# Patient Record
Sex: Female | Born: 1995 | Race: White | Hispanic: No | Marital: Married | Smoking: Current some day smoker
Health system: Southern US, Community
[De-identification: ages and names within clinical notes are randomized; demographics above are authoritative.]

---
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
## 2017-12-05 ENCOUNTER — Emergency Department: Payer: No Typology Code available for payment source

## 2017-12-05 ENCOUNTER — Emergency Department
Admission: EM | Admit: 2017-12-05 | Discharge: 2017-12-05 | Disposition: A | Discharge status: Home / Self Care | Payer: No Typology Code available for payment source | Attending: Emergency Medicine | Admitting: Emergency Medicine

## 2017-12-05 ENCOUNTER — Encounter: Payer: Self-pay | Admitting: Emergency Medicine

## 2017-12-05 ENCOUNTER — Other Ambulatory Visit: Payer: Self-pay

## 2017-12-05 DIAGNOSIS — Y939 Activity, unspecified: Secondary | ICD-10-CM | POA: Diagnosis not present

## 2017-12-05 DIAGNOSIS — M545 Low back pain, unspecified: Secondary | ICD-10-CM

## 2017-12-05 DIAGNOSIS — Z79899 Other long term (current) drug therapy: Secondary | ICD-10-CM | POA: Diagnosis not present

## 2017-12-05 DIAGNOSIS — S50812A Abrasion of left forearm, initial encounter: Secondary | ICD-10-CM | POA: Diagnosis not present

## 2017-12-05 DIAGNOSIS — Y999 Unspecified external cause status: Secondary | ICD-10-CM | POA: Insufficient documentation

## 2017-12-05 DIAGNOSIS — F17200 Nicotine dependence, unspecified, uncomplicated: Secondary | ICD-10-CM | POA: Diagnosis not present

## 2017-12-05 DIAGNOSIS — Y92410 Unspecified street and highway as the place of occurrence of the external cause: Secondary | ICD-10-CM | POA: Diagnosis not present

## 2017-12-05 DIAGNOSIS — T07XXXA Unspecified multiple injuries, initial encounter: Secondary | ICD-10-CM

## 2017-12-05 DIAGNOSIS — S59911A Unspecified injury of right forearm, initial encounter: Secondary | ICD-10-CM | POA: Diagnosis present

## 2017-12-05 DIAGNOSIS — S50811A Abrasion of right forearm, initial encounter: Secondary | ICD-10-CM | POA: Insufficient documentation

## 2017-12-05 DIAGNOSIS — M7918 Myalgia, other site: Secondary | ICD-10-CM | POA: Diagnosis not present

## 2017-12-05 LAB — POCT PREGNANCY, URINE: Preg Test, Ur: NEGATIVE

## 2017-12-05 MED ORDER — NAPROXEN 500 MG PO TABS: 500.0000 mg | ORAL_TABLET | Freq: Two times a day (BID) | ORAL | 0 refills | Status: DC

## 2017-12-05 MED ORDER — ONDANSETRON 4 MG PO TBDP: 4.0000 mg | ORAL_TABLET | Freq: Three times a day (TID) | ORAL | 0 refills | Status: DC | PRN

## 2017-12-05 MED ORDER — CYCLOBENZAPRINE HCL 10 MG PO TABS: 10.0000 mg | ORAL_TABLET | Freq: Three times a day (TID) | ORAL | 0 refills | Status: DC | PRN

## 2017-12-05 MED ORDER — NAPROXEN 500 MG PO TABS
500.0000 mg | ORAL_TABLET | Freq: Once | ORAL | Status: AC
  Administered 2017-12-05: 500 mg via ORAL
  Filled 2017-12-05: qty 1

## 2017-12-05 MED ORDER — CYCLOBENZAPRINE HCL 10 MG PO TABS
10.0000 mg | ORAL_TABLET | Freq: Once | ORAL | Status: AC
  Administered 2017-12-05: 10 mg via ORAL
  Filled 2017-12-05: qty 1
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
## 2019-03-19 ENCOUNTER — Emergency Department: Payer: Self-pay

## 2019-03-19 ENCOUNTER — Other Ambulatory Visit: Payer: Self-pay

## 2019-03-19 ENCOUNTER — Emergency Department
Admission: EM | Admit: 2019-03-19 | Discharge: 2019-03-19 | Disposition: A | Discharge status: Home / Self Care | Payer: Self-pay | Attending: Emergency Medicine | Admitting: Emergency Medicine

## 2019-03-19 DIAGNOSIS — Y9301 Activity, walking, marching and hiking: Secondary | ICD-10-CM | POA: Insufficient documentation

## 2019-03-19 DIAGNOSIS — S93402A Sprain of unspecified ligament of left ankle, initial encounter: Secondary | ICD-10-CM

## 2019-03-19 DIAGNOSIS — Y999 Unspecified external cause status: Secondary | ICD-10-CM | POA: Insufficient documentation

## 2019-03-19 DIAGNOSIS — Y9289 Other specified places as the place of occurrence of the external cause: Secondary | ICD-10-CM | POA: Insufficient documentation

## 2019-03-19 DIAGNOSIS — X501XXA Overexertion from prolonged static or awkward postures, initial encounter: Secondary | ICD-10-CM | POA: Insufficient documentation

## 2019-03-19 DIAGNOSIS — S93492A Sprain of other ligament of left ankle, initial encounter: Secondary | ICD-10-CM | POA: Insufficient documentation

## 2019-03-19 DIAGNOSIS — F17210 Nicotine dependence, cigarettes, uncomplicated: Secondary | ICD-10-CM | POA: Insufficient documentation

## 2019-03-19 MED ORDER — MELOXICAM 15 MG PO TABS: 15.0000 mg | ORAL_TABLET | Freq: Every day | ORAL | 1 refills | Status: AC
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
## 2019-03-19 NOTE — ED Provider Notes (Signed)
[facility] Emergency Department Provider Note  ____________________________________________  Time seen: Approximately 5:24 PM  I have reviewed the triage vital signs and the nursing notes.   HISTORY  Chief Complaint Ankle Pain    HPI Paula Bennett is a 23 y.o. female presents to the emergency department with pain and difficulty ambulating since she inverted her left ankle.  Patient states that she was outside looking for her dog when she stepped down unexpectedly to uneven terrain.  Patient has pain both over the anterior talofibular ligament and the deltoid ligament.  She did not hit her head or neck during injury.  She states that she has had difficulty bearing weight since injury occurred.  She denies prior left ankle injuries in the past.  No numbness or tingling in the bilateral lower extremities.  No other alleviating measures have been attempted.        History reviewed. No pertinent past medical history.  There are no active problems to display for this patient.   History reviewed. No pertinent surgical history.  Prior to Admission medications   Medication Sig Start Date End Date Taking? Authorizing Provider  cyclobenzaprine (FLEXERIL) 10 MG tablet Take 1 tablet (10 mg total) by mouth 3 (three) times daily as needed for muscle spasms. 12/05/17   Triplett, Rulon Eisenmenger B, FNP  meloxicam (MOBIC) 15 MG tablet Take 1 tablet (15 mg total) by mouth daily for 7 days. 03/19/19 03/26/19  Orvil Feil, PA-C  naproxen (NAPROSYN) 500 MG tablet Take 1 tablet (500 mg total) by mouth 2 (two) times daily with a meal. 12/05/17   Triplett, Cari B, FNP  ondansetron (ZOFRAN-ODT) 4 MG disintegrating tablet Take 1 tablet (4 mg total) by mouth every 8 (eight) hours as needed for nausea or vomiting. 12/05/17   Triplett, Cari B, FNP  oxyCODONE-acetaminophen (ROXICET) 5-325 MG tablet Take 1-2 tablets by mouth every 4 (four) hours as needed for severe pain. 10/06/15   Beers, Charmayne Sheer, PA-C    Allergies Patient has no known allergies.  No family history on file.  Social History Social History   Tobacco Use  . Smoking status: Current Some Day Smoker  . Smokeless tobacco: Never Used  Substance Use Topics  . Alcohol use: No  . Drug use: No     Review of Systems  Constitutional: No fever/chills Eyes: No visual changes. No discharge ENT: No upper respiratory complaints. Cardiovascular: no chest pain. Respiratory: no cough. No SOB. Gastrointestinal: No abdominal pain.  No nausea, no vomiting.  No diarrhea.  No constipation. Musculoskeletal: Patient has left ankle pain.  Skin: Negative for rash, abrasions, lacerations, ecchymosis. Neurological: Negative for headaches, focal weakness or numbness.  ____________________________________________   PHYSICAL EXAM:  VITAL SIGNS: ED Triage Vitals  Enc Vitals Group     BP 03/19/19 1505 122/75     Pulse Rate 03/19/19 1505 84     Resp 03/19/19 1505 17     Temp 03/19/19 1505 98.2 F (36.8 C)     Temp Source 03/19/19 1505 Oral     SpO2 03/19/19 1505 97 %     Weight 03/19/19 1506 185 lb (83.9 kg)     Height 03/19/19 1506 5\' 7"  (1.702 m)     Head Circumference --      Peak Flow --      Pain Score 03/19/19 1506 10     Pain Loc --      Pain Edu? --      Excl. in  GC? --      Constitutional: Alert and oriented. Well appearing and in no acute distress. Eyes: Conjunctivae are normal. PERRL. EOMI. Head: Atraumatic. UJW:JXBJYNWGNFAOZHENT:Cardiovascular: Normal rate, regular rhythm. Normal S1 and S2.  Good peripheral circulation. Respiratory: Normal respiratory effort without tachypnea or retractions. Lungs CTAB. Good air entry to the bases with no decreased or absent breath sounds. Gastrointestinal: Bowel sounds 4 quadrants. Soft and nontender to palpation. No guarding or rigidity. No palpable masses. No distention. No CVA tenderness. Musculoskeletal: Patient has difficulty performing full range of motion at left ankle.  She has  tenderness to palpation of the anterior talofibular ligament and the deltoid ligament.  She is able to move all 5 toes.  Palpable dorsalis pedis pulse, left.  Capillary refill less than 2 seconds of all 5 left toes. Neurologic:  Normal speech and language. No gross focal neurologic deficits are appreciated.  Skin:  Skin is warm, dry and intact. No rash noted. Psychiatric: Mood and affect are normal. Speech and behavior are normal. Patient exhibits appropriate insight and judgement.   ____________________________________________   LABS (all labs ordered are listed, but only abnormal results are displayed)  Labs Reviewed - No data to display ____________________________________________  EKG   ____________________________________________  RADIOLOGY I personally viewed and evaluated these images as part of my medical decision making, as well as reviewing the written report by the radiologist.  Dg Ankle Complete Left  Result Date: 03/19/2019 CLINICAL DATA:  Patient complains of left ankle pain, patient states she was walking outside when she stumbled on left ankle, patient states she heard "3 pops" Patient states she has rolled her left ankle before and had previous injuries to left tib/fib. EXAM: LEFT ANKLE COMPLETE - 3+ VIEW COMPARISON:  Left ankle radiographs 10/06/2015 FINDINGS: There is no evidence of fracture, dislocation, or joint effusion. Joint spaces well maintained. Talar dome intact. There is no evidence of arthropathy or other focal bone abnormality. Mild lateral ankle soft tissue swelling. IMPRESSION: No acute osseous abnormality in the left ankle. Electronically Signed   By: Emmaline KluverNancy  Ballantyne M.D.   On: 03/19/2019 16:00    ____________________________________________    PROCEDURES  Procedure(s) performed:    Procedures    Medications - No data to display   ____________________________________________   INITIAL IMPRESSION / ASSESSMENT AND PLAN / ED  COURSE  Pertinent labs & imaging results that were available during my care of the patient were reviewed by me and considered in my medical decision making (see chart for details).  Review of the [place] CSRS was performed in accordance of the [facility] prior to dispensing any controlled drugs.           Assessment and plan Left ankle pain 23 year old female presents to the emergency department with acute left ankle pain after an inversion type ankle injury.  Vital signs were reassuring at triage.  Patient had tenderness to palpation over the anterior talofibular ligament and the deltoid ligament.  X-ray examination was noncontributory for acute fracture.  An Ace wrap was applied in the emergency department and crutches were provided.  Patient was discharged with meloxicam after she denied possibility of pregnancy.  She was advised to follow-up with podiatry as needed if conservative management techniques fail.  All patient questions were answered.     ____________________________________________  FINAL CLINICAL IMPRESSION(S) / ED DIAGNOSES  Final diagnoses:  Sprain of left ankle, unspecified ligament, initial encounter      NEW MEDICATIONS STARTED DURING THIS VISIT:  ED Discharge Orders  Ordered    meloxicam (MOBIC) 15 MG tablet  Daily     03/19/19 1622              This chart was dictated using voice recognition software/Dragon. Despite best efforts to proofread, errors can occur which can change the meaning. Any change was purely unintentional.    Lannie Fields, PA-C 03/19/19 1727    Carrie Mew, MD 03/19/19 2251

## 2020-04-17 IMAGING — DX DG ANKLE COMPLETE 3+V*L*
3 series · 3 of 3 positions shown · non-contrast
Comparison: Left ankle radiographs 10/06/2015

CLINICAL DATA: Patient complains of left ankle pain, patient states
she was walking outside when she stumbled on left ankle, patient
states she heard "3 pops" Patient states she has rolled her left
ankle before and had previous injuries to left tib/fib.

EXAM:
LEFT ANKLE COMPLETE - 3+ VIEW

[ankle ap]
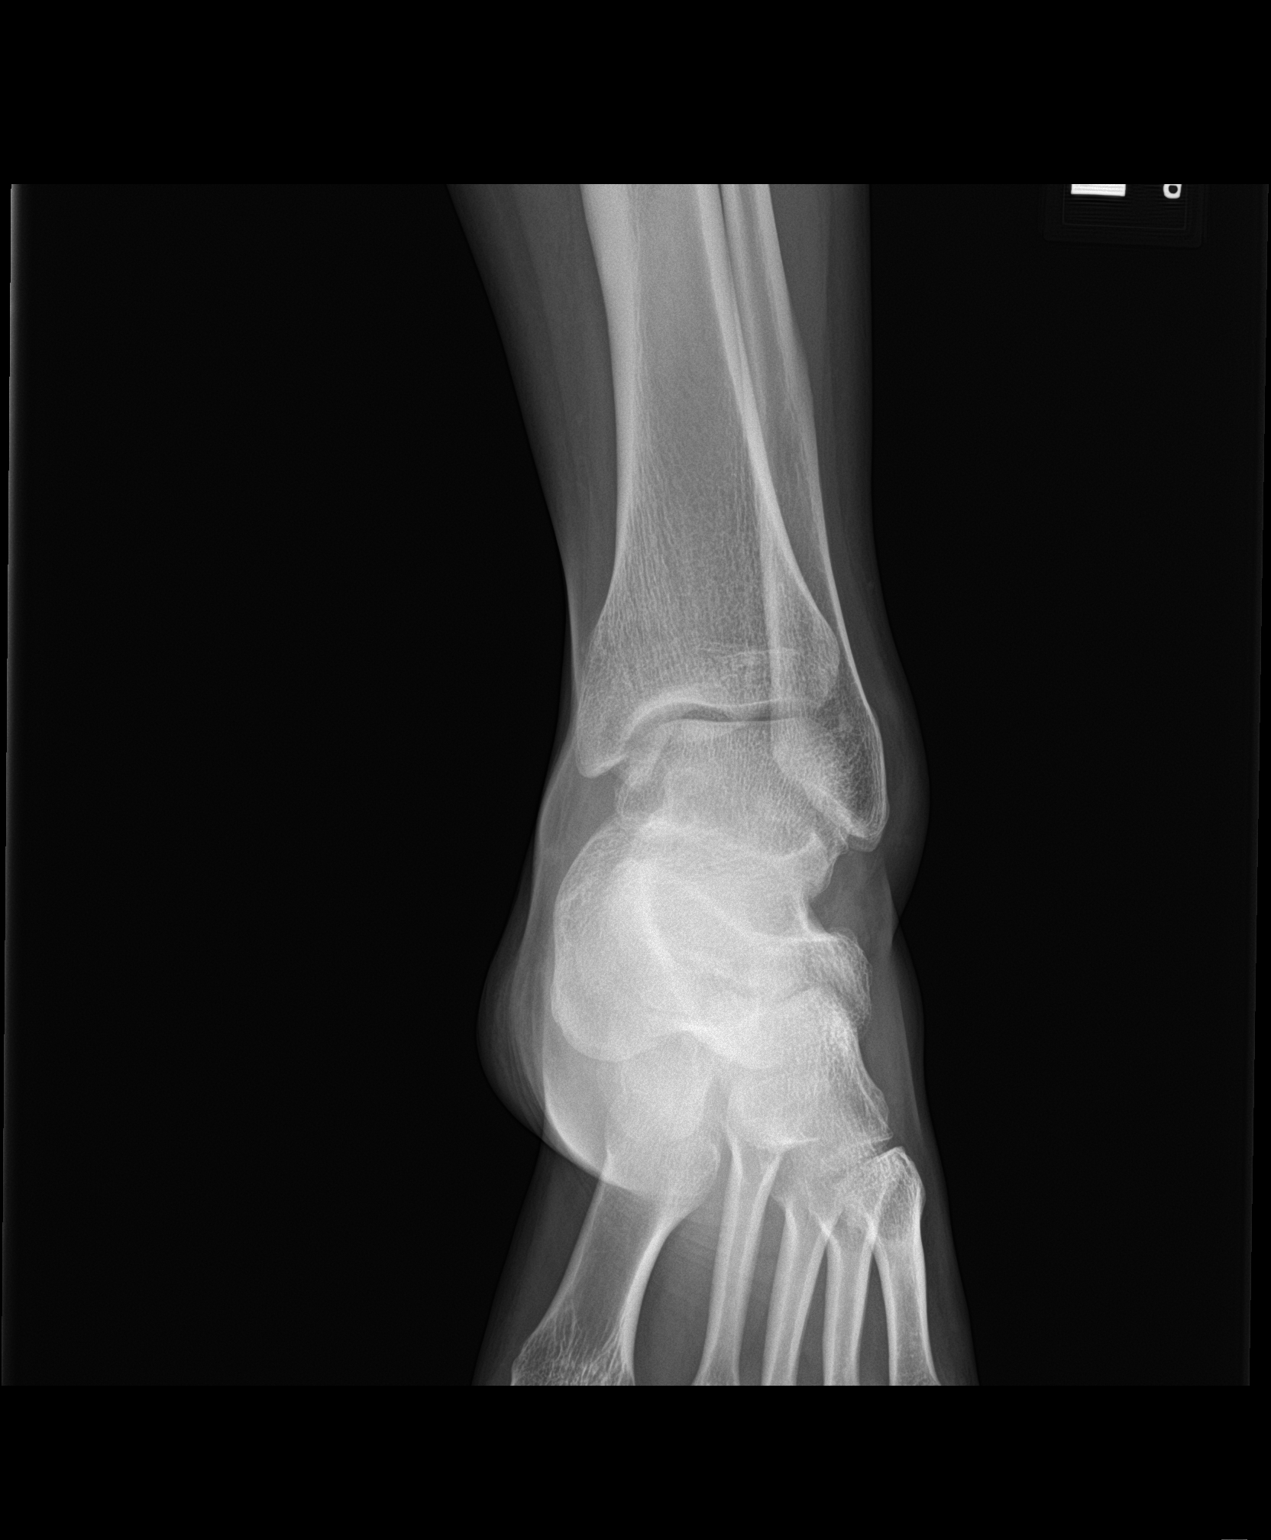

[ankle obl]
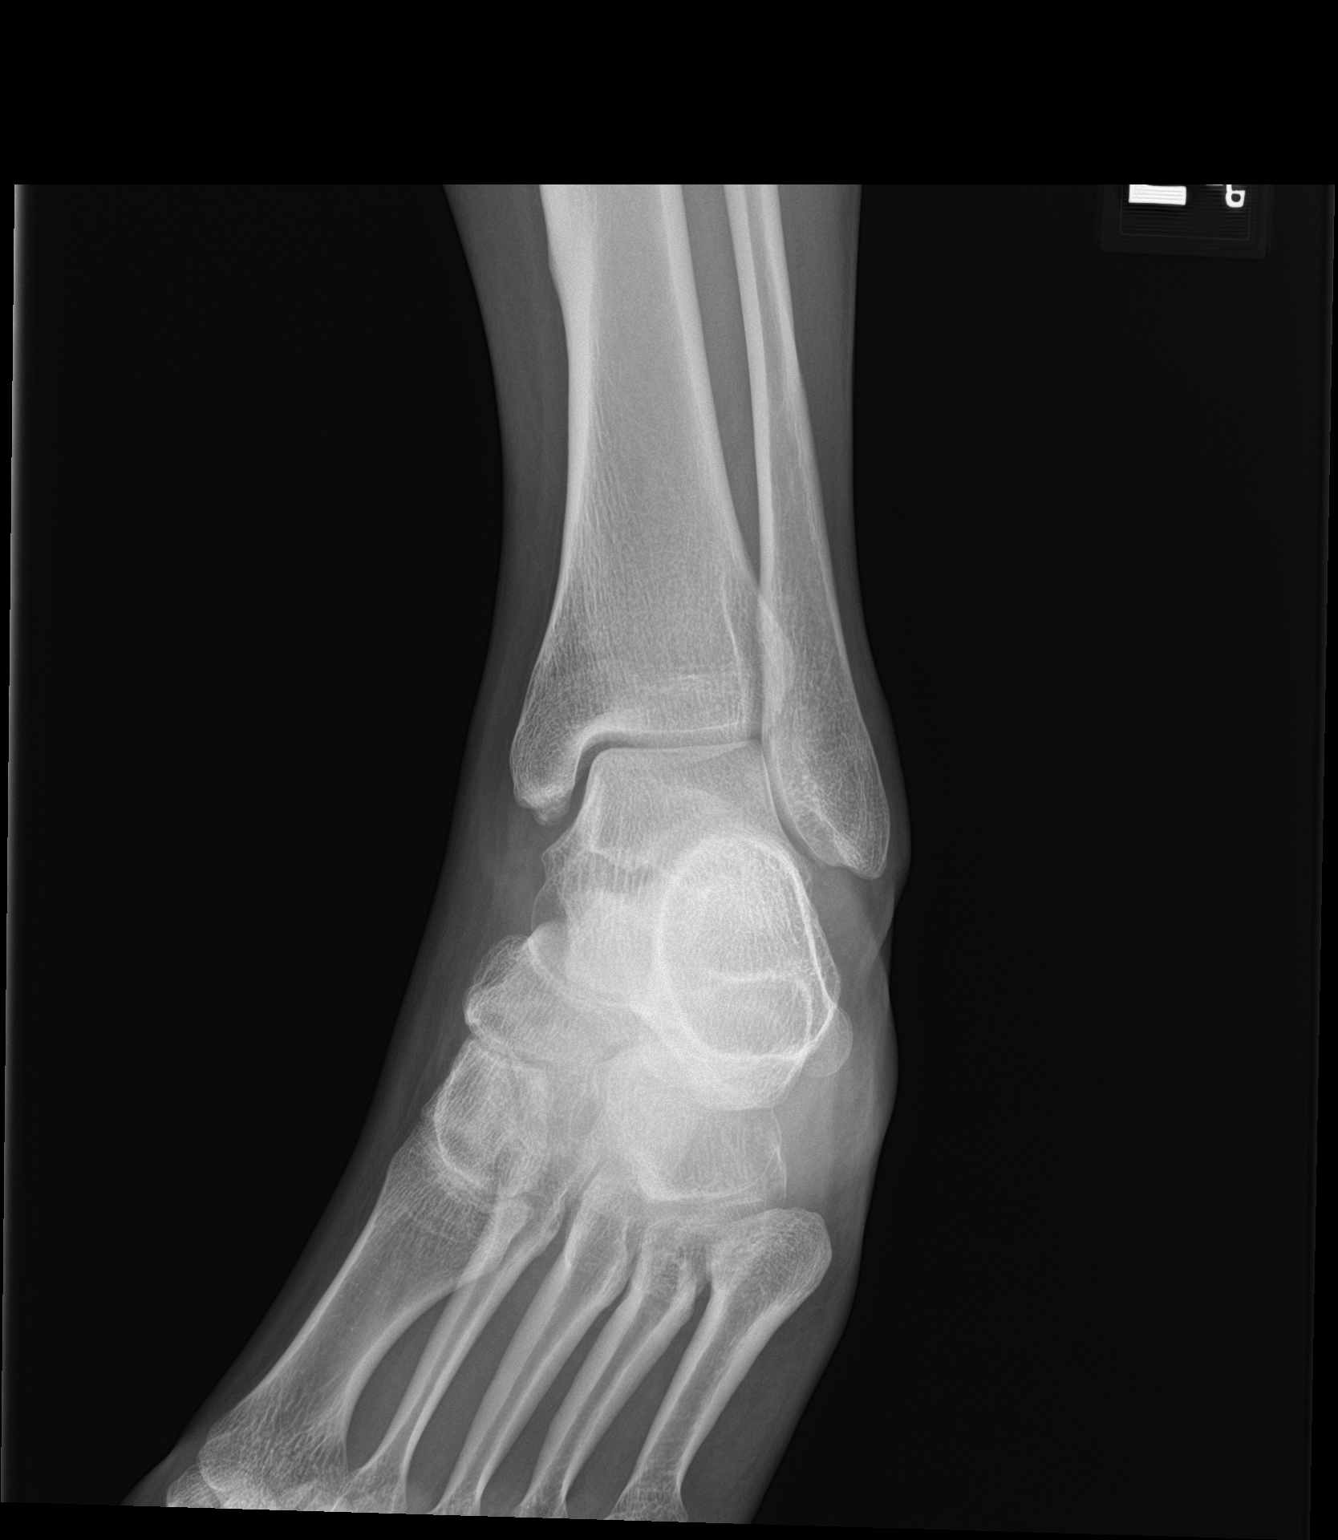

[ankle lat]
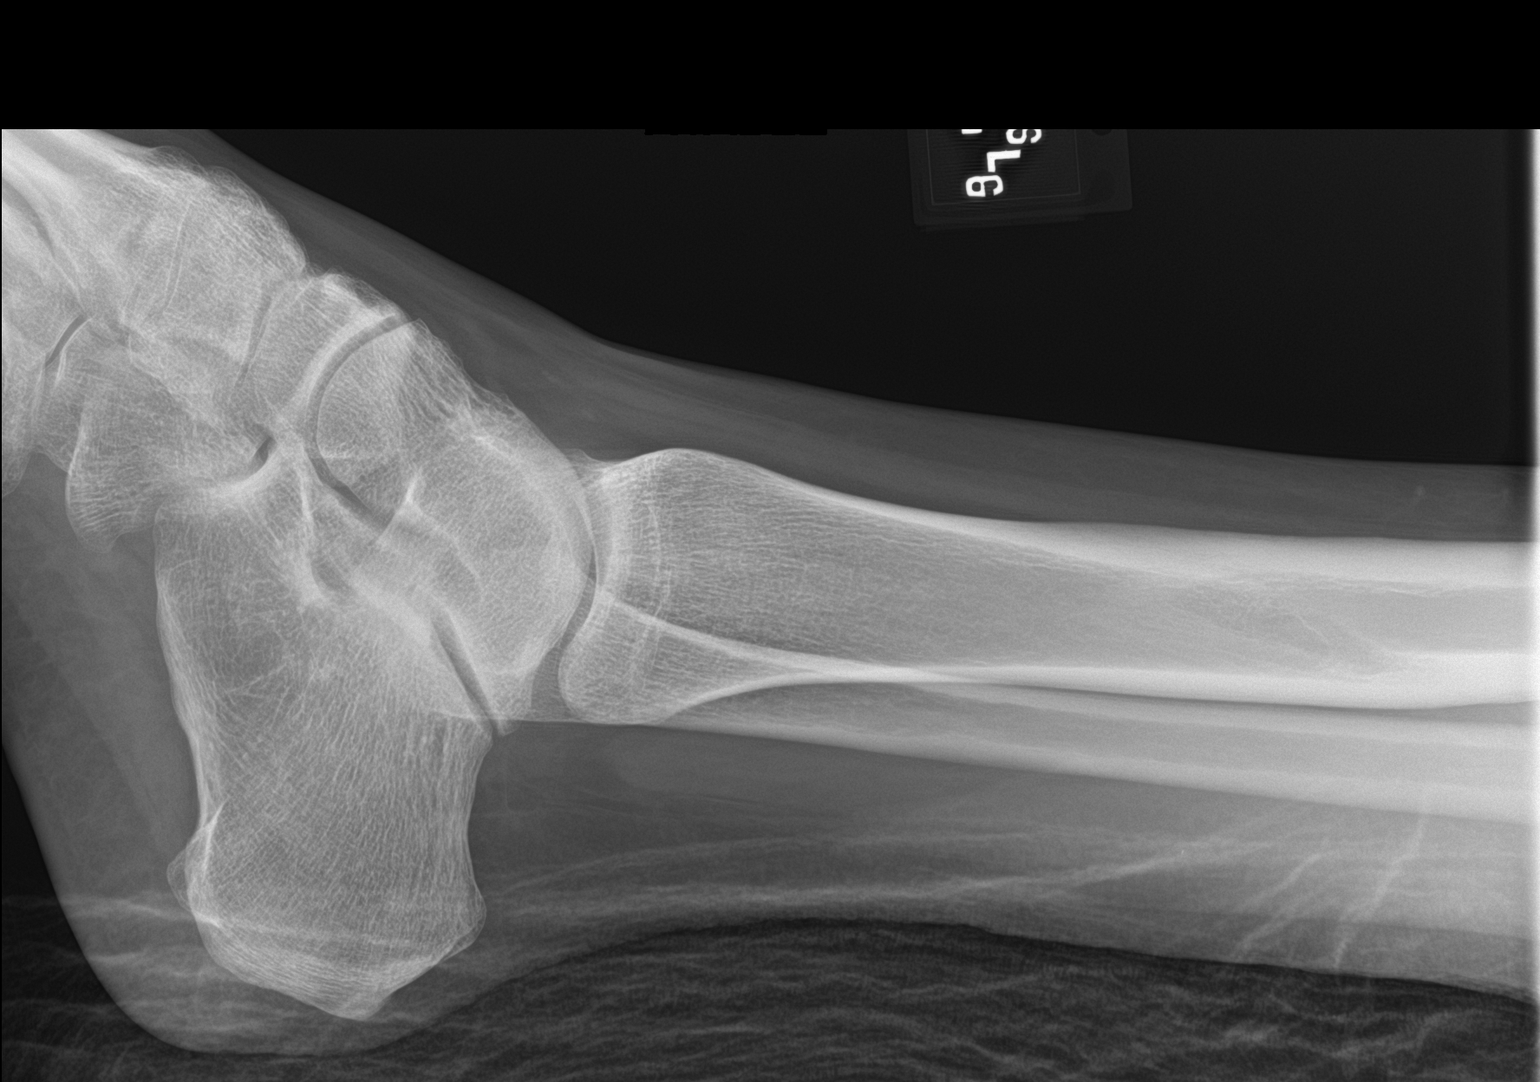

[3 of 3 positions shown; findings below may reference images not displayed]

FINDINGS: There is no evidence of fracture, dislocation, or joint effusion.
Joint spaces well maintained. Talar dome intact. There is no
evidence of arthropathy or other focal bone abnormality. Mild
lateral ankle soft tissue swelling.
IMPRESSION: No acute osseous abnormality in the left ankle.

## 2020-06-16 ENCOUNTER — Emergency Department: Payer: Self-pay

## 2020-06-16 ENCOUNTER — Other Ambulatory Visit: Payer: Self-pay

## 2020-06-16 ENCOUNTER — Emergency Department
Admission: EM | Admit: 2020-06-16 | Discharge: 2020-06-16 | Disposition: A | Discharge status: Home / Self Care | Payer: Self-pay | Attending: Emergency Medicine | Admitting: Emergency Medicine

## 2020-06-16 DIAGNOSIS — R0789 Other chest pain: Secondary | ICD-10-CM

## 2020-06-16 DIAGNOSIS — R1010 Upper abdominal pain, unspecified: Secondary | ICD-10-CM | POA: Insufficient documentation

## 2020-06-16 DIAGNOSIS — Z8616 Personal history of COVID-19: Secondary | ICD-10-CM | POA: Insufficient documentation

## 2020-06-16 DIAGNOSIS — F17200 Nicotine dependence, unspecified, uncomplicated: Secondary | ICD-10-CM | POA: Insufficient documentation

## 2020-06-16 DIAGNOSIS — M940 Chondrocostal junction syndrome [Tietze]: Secondary | ICD-10-CM | POA: Insufficient documentation

## 2020-06-16 LAB — URINE DRUG SCREEN, QUALITATIVE (ARMC ONLY)
Amphetamines, Ur Screen: NOT DETECTED
Barbiturates, Ur Screen: NOT DETECTED
Benzodiazepine, Ur Scrn: NOT DETECTED
Cannabinoid 50 Ng, Ur ~~LOC~~: POSITIVE — AB
Cocaine Metabolite,Ur ~~LOC~~: NOT DETECTED
MDMA (Ecstasy)Ur Screen: NOT DETECTED
Methadone Scn, Ur: NOT DETECTED
Opiate, Ur Screen: NOT DETECTED
Phencyclidine (PCP) Ur S: NOT DETECTED
Tricyclic, Ur Screen: NOT DETECTED

## 2020-06-16 LAB — URINALYSIS, COMPLETE (UACMP) WITH MICROSCOPIC
Bacteria, UA: NONE SEEN
Bilirubin Urine: NEGATIVE
Glucose, UA: NEGATIVE mg/dL
Hgb urine dipstick: NEGATIVE
Ketones, ur: NEGATIVE mg/dL
Leukocytes,Ua: NEGATIVE
Nitrite: NEGATIVE
Protein, ur: NEGATIVE mg/dL
Specific Gravity, Urine: 1.029 (ref 1.005–1.030)
pH: 6 (ref 5.0–8.0)

## 2020-06-16 LAB — COMPREHENSIVE METABOLIC PANEL
ALT: 15 U/L (ref 0–44)
AST: 18 U/L (ref 15–41)
Albumin: 4.3 g/dL (ref 3.5–5.0)
Alkaline Phosphatase: 53 U/L (ref 38–126)
Anion gap: 11 (ref 5–15)
BUN: 16 mg/dL (ref 6–20)
CO2: 21 mmol/L — ABNORMAL LOW (ref 22–32)
Calcium: 9.4 mg/dL (ref 8.9–10.3)
Chloride: 109 mmol/L (ref 98–111)
Creatinine, Ser: 0.81 mg/dL (ref 0.44–1.00)
GFR, Estimated: >60 mL/min (ref >60)
Glucose, Bld: 101 mg/dL — ABNORMAL HIGH (ref 70–99)
Potassium: 3.8 mmol/L (ref 3.5–5.1)
Sodium: 141 mmol/L (ref 135–145)
Total Bilirubin: 0.5 mg/dL (ref 0.3–1.2)
Total Protein: 7.5 g/dL (ref 6.5–8.1)

## 2020-06-16 LAB — CBC
HCT: 39.9 % (ref 36.0–46.0)
Hemoglobin: 13.8 g/dL (ref 12.0–15.0)
MCH: 29.4 pg (ref 26.0–34.0)
MCHC: 34.6 g/dL (ref 30.0–36.0)
MCV: 85.1 fL (ref 80.0–100.0)
Platelets: 222 10*3/uL (ref 150–400)
RBC: 4.69 MIL/uL (ref 3.87–5.11)
RDW: 12.1 % (ref 11.5–15.5)
WBC: 6.9 10*3/uL (ref 4.0–10.5)
nRBC: 0 % (ref 0.0–0.2)

## 2020-06-16 LAB — POC URINE PREG, ED: Preg Test, Ur: NEGATIVE

## 2020-06-16 LAB — LIPASE, BLOOD: Lipase: 25 U/L (ref 11–51)

## 2020-06-16 MED ORDER — TRAMADOL HCL 50 MG PO TABS: 50.0000 mg | ORAL_TABLET | Freq: Three times a day (TID) | ORAL | 0 refills | Status: AC | PRN

## 2020-06-16 MED ORDER — MORPHINE SULFATE (PF) 4 MG/ML IV SOLN
4.0000 mg | Freq: Once | INTRAVENOUS | Status: AC
  Administered 2020-06-16: 4 mg via INTRAVENOUS
  Filled 2020-06-16: qty 1

## 2020-06-16 MED ORDER — LIDOCAINE VISCOUS HCL 2 % MT SOLN
15.0000 mL | Freq: Once | OROMUCOSAL | Status: AC
  Administered 2020-06-16: 15 mL via ORAL
  Filled 2020-06-16: qty 15

## 2020-06-16 MED ORDER — NAPROXEN 375 MG PO TABS: 375.0000 mg | ORAL_TABLET | Freq: Two times a day (BID) | ORAL | 0 refills | Status: AC

## 2020-06-16 MED ORDER — SODIUM CHLORIDE 0.9 % IV BOLUS
1000.0000 mL | Freq: Once | INTRAVENOUS | Status: AC
  Administered 2020-06-16: 1000 mL via INTRAVENOUS

## 2020-06-16 MED ORDER — IOHEXOL 350 MG/ML SOLN
75.0000 mL | Freq: Once | INTRAVENOUS | Status: AC | PRN
  Administered 2020-06-16: 75 mL via INTRAVENOUS

## 2020-06-16 MED ORDER — HYDROCODONE-ACETAMINOPHEN 5-325 MG PO TABS
2.0000 | ORAL_TABLET | Freq: Once | ORAL | Status: AC
  Administered 2020-06-16: 2 via ORAL
  Filled 2020-06-16: qty 2

## 2020-06-16 MED ORDER — ALUM & MAG HYDROXIDE-SIMETH 200-200-20 MG/5ML PO SUSP
30.0000 mL | Freq: Once | ORAL | Status: AC
  Administered 2020-06-16: 30 mL via ORAL
  Filled 2020-06-16: qty 30

## 2020-06-16 MED ORDER — ONDANSETRON HCL 4 MG/2ML IJ SOLN
4.0000 mg | Freq: Once | INTRAMUSCULAR | Status: AC
  Administered 2020-06-16: 4 mg via INTRAVENOUS
  Filled 2020-06-16: qty 2

## 2020-06-16 MED ORDER — KETOROLAC TROMETHAMINE 30 MG/ML IJ SOLN
15.0000 mg | Freq: Once | INTRAMUSCULAR | Status: AC
  Administered 2020-06-16: 15 mg via INTRAVENOUS
  Filled 2020-06-16: qty 1

## 2020-06-16 NOTE — ED Provider Notes (Signed)
[facility] Emergency Department Provider Note  ____________________________________________   Event Date/Time   First MD Initiated Contact with Patient 06/16/20 1005     (approximate)  I have reviewed the triage vital signs and the nursing notes.   HISTORY  Chief Complaint Abdominal Pain    HPI Paula Bennett is a 25 y.o. female  Here with chest pain. Pt reports that for the last week she's had sharp, stabbing, positional left-sided rib/upper abd pain. Pain has been constant, worse w/ movement and palpation. Worse w/ deep inspiration. She's had some n/v as well thuogh not persistent. Has tried OTC meds w/o relief. Of note, she had COVID several weeks ago and has had persistent cough. No le swelling. No h/o dvt/pe. No estrogen use.         History reviewed. No pertinent past medical history.  There are no problems to display for this patient.   History reviewed. No pertinent surgical history.  Prior to Admission medications   Medication Sig Start Date End Date Taking? Authorizing Provider  naproxen (NAPROSYN) 375 MG tablet Take 1 tablet (375 mg total) by mouth 2 (two) times daily with a meal for 7 days. 06/16/20 06/23/20 Yes Shaune Pollack, MD  traMADol (ULTRAM) 50 MG tablet Take 1 tablet (50 mg total) by mouth every 8 (eight) hours as needed for severe pain. 06/16/20 06/16/21 Yes Shaune Pollack, MD  cyclobenzaprine (FLEXERIL) 10 MG tablet Take 1 tablet (10 mg total) by mouth 3 (three) times daily as needed for muscle spasms. 12/05/17   Triplett, Rulon Eisenmenger B, FNP  ondansetron (ZOFRAN-ODT) 4 MG disintegrating tablet Take 1 tablet (4 mg total) by mouth every 8 (eight) hours as needed for nausea or vomiting. 12/05/17   Kem Boroughs B, FNP    Allergies Patient has no known allergies.  No family history on file.  Social History Social History   Tobacco Use  . Smoking status: Current Some Day Smoker  . Smokeless tobacco: Never Used  Substance Use Topics   . Alcohol use: No  . Drug use: No    Review of Systems  Review of Systems  Constitutional: Negative for fatigue and fever.  HENT: Negative for congestion and sore throat.   Eyes: Negative for visual disturbance.  Respiratory: Negative for cough and shortness of breath.   Cardiovascular: Positive for chest pain.  Gastrointestinal: Positive for nausea and vomiting. Negative for abdominal pain and diarrhea.  Genitourinary: Negative for flank pain.  Musculoskeletal: Negative for back pain and neck pain.  Skin: Negative for rash and wound.  Neurological: Negative for weakness.     ____________________________________________  PHYSICAL EXAM:      VITAL SIGNS: ED Triage Vitals  Enc Vitals Group     BP 06/16/20 0911 (!) 123/93     Pulse Rate 06/16/20 0911 91     Resp 06/16/20 0911 20     Temp 06/16/20 0911 98.3 F (36.8 C)     Temp Source 06/16/20 0911 Oral     SpO2 06/16/20 0911 99 %     Weight --      Height --      Head Circumference --      Peak Flow --      Pain Score 06/16/20 0906 8     Pain Loc --      Pain Edu? --      Excl. in GC? --      Physical Exam Vitals and nursing note reviewed.  Constitutional:  General: She is not in acute distress.    Appearance: She is well-developed.  HENT:     Head: Normocephalic and atraumatic.  Eyes:     Conjunctiva/sclera: Conjunctivae normal.  Cardiovascular:     Rate and Rhythm: Normal rate and regular rhythm.     Heart sounds: Normal heart sounds. No murmur heard. No friction rub.  Pulmonary:     Effort: Pulmonary effort is normal. No respiratory distress.     Breath sounds: Normal breath sounds. No wheezing or rales.  Chest:     Comments: Marked TTP over left lower rib cage. No bruising or deformity. No skin lesions. Abdominal:     General: There is no distension.     Palpations: Abdomen is soft.     Tenderness: There is abdominal tenderness (TTP over left subcostal margin).  Musculoskeletal:     Cervical  back: Neck supple.  Skin:    General: Skin is warm.     Capillary Refill: Capillary refill takes less than 2 seconds.  Neurological:     Mental Status: She is alert and oriented to person, place, and time.     Motor: No abnormal muscle tone.       ____________________________________________   LABS (all labs ordered are listed, but only abnormal results are displayed)  Labs Reviewed  COMPREHENSIVE METABOLIC PANEL - Abnormal; Notable for the following components:      Result Value   CO2 21 (*)    Glucose, Bld 101 (*)    All other components within normal limits  URINALYSIS, COMPLETE (UACMP) WITH MICROSCOPIC - Abnormal; Notable for the following components:   Color, Urine YELLOW (*)    APPearance HAZY (*)    All other components within normal limits  URINE DRUG SCREEN, QUALITATIVE ([facility] ONLY) - Abnormal; Notable for the following components:   Cannabinoid 50 Ng, Ur Protivin POSITIVE (*)    All other components within normal limits  LIPASE, BLOOD  CBC  POC URINE PREG, ED    ____________________________________________  EKG: Sinus bradycardia, VR 48. PR 127, QRS 82, QTc 419. No acute ST elevations or depressions. No ischemia or infarct. ________________________________________  RADIOLOGY All imaging, including plain films, CT scans, and ultrasounds, independently reviewed by me, and interpretations confirmed via formal radiology reads.  ED MD interpretation:   CT Angio: no PE, noa cute abnormality  Official radiology report(s): CT Angio Chest PE W and/or Wo Contrast  Result Date: 06/16/2020 CLINICAL DATA:  Remote COVID pneumonia, left upper quadrant pain EXAM: CT ANGIOGRAPHY CHEST WITH CONTRAST TECHNIQUE: Multidetector CT imaging of the chest was performed using the standard protocol during bolus administration of intravenous contrast. Multiplanar CT image reconstructions and MIPs were obtained to evaluate the vascular anatomy. CONTRAST:  77mL OMNIPAQUE IOHEXOL 350 MG/ML SOLN  COMPARISON:  None. FINDINGS: Cardiovascular: Satisfactory opacification of the pulmonary arteries to the segmental level. No evidence of pulmonary embolism. Normal heart size. No pericardial effusion. The central pulmonary arteries are of normal caliber. The thoracic aorta is unremarkable. Mediastinum/Nodes: No enlarged mediastinal, hilar, or axillary lymph nodes. Thyroid gland, trachea, and esophagus demonstrate no significant findings. Lungs/Pleura: Lungs are clear. No pleural effusion or pneumothorax. Upper Abdomen: No acute abnormality. In particular, the spleen is of normal size and no intrasplenic lesions or perisplenic fluid collections are identified. Heterogeneous enhancement of the spleen on upper abdominal images likely relates to the relatively early phase of enhancement. Musculoskeletal: No acute abnormality Review of the MIP images confirms the above findings. IMPRESSION: No pulmonary embolism.  No acute intrathoracic pathology identified. Normal appearance of the spleen. No radiographic explanation for the patient's reported left upper quadrant abdominal pain. Electronically Signed   By: Helyn Numbers MD   On: 06/16/2020 11:12    ____________________________________________  PROCEDURES   Procedure(s) performed (including Critical Care):  Procedures  ____________________________________________  INITIAL IMPRESSION / MDM / ASSESSMENT AND PLAN / ED COURSE  As part of my medical decision making, I reviewed the following data within the electronic MEDICAL RECORD NUMBER Nursing notes reviewed and incorporated, Old chart reviewed, Notes from prior ED visits, and [place] Controlled Substance Database       *Paula Bennett was evaluated in Emergency Department on 06/16/2020 for the symptoms described in the history of present illness. She was evaluated in the context of the global COVID-19 pandemic, which necessitated consideration that the patient might be at risk for infection with the SARS-CoV-2  virus that causes COVID-19. Institutional protocols and algorithms that pertain to the evaluation of patients at risk for COVID-19 are in a state of rapid change based on information released by regulatory bodies including the CDC and federal and state organizations. These policies and algorithms were followed during the patient's care in the ED.  Some ED evaluations and interventions may be delayed as a result of limited staffing during the pandemic.*     Medical Decision Making:  25 yo F here with left rib/chest pain. EKG nonischemic. Labs unremarkable with normal CBC, Hgb, and LFTs. Pain localized primarily along lower left sternal border, so in setting of recent COVID, CT angio obtained for eval of PE. CT angio reviewed, neg for PE or other abnormality. Given splenomegaly history, spleen imaged and also is negative/unremarkable. UA wthout signs of UTI. No cardiac risk factors.  Will treat for likely MSK pain 2/2 coughing from recent COVID. NSAIDs, analgesia, outpt follow-up.  ____________________________________________  FINAL CLINICAL IMPRESSION(S) / ED DIAGNOSES  Final diagnoses:  Atypical chest pain  Costochondritis     MEDICATIONS GIVEN DURING THIS VISIT:  Medications  ketorolac (TORADOL) 30 MG/ML injection 15 mg (15 mg Intravenous Given 06/16/20 1045)  ondansetron (ZOFRAN) injection 4 mg (4 mg Intravenous Given 06/16/20 1045)  sodium chloride 0.9 % bolus 1,000 mL (0 mLs Intravenous Stopped 06/16/20 1244)  morphine 4 MG/ML injection 4 mg (4 mg Intravenous Given 06/16/20 1045)  iohexol (OMNIPAQUE) 350 MG/ML injection 75 mL (75 mLs Intravenous Contrast Given 06/16/20 1053)  HYDROcodone-acetaminophen (NORCO/VICODIN) 5-325 MG per tablet 2 tablet (2 tablets Oral Given 06/16/20 1155)  alum & mag hydroxide-simeth (MAALOX/MYLANTA) 200-200-20 MG/5ML suspension 30 mL (30 mLs Oral Given 06/16/20 1156)    And  lidocaine (XYLOCAINE) 2 % viscous mouth solution 15 mL (15 mLs Oral Given 06/16/20 1156)      ED Discharge Orders         Ordered    naproxen (NAPROSYN) 375 MG tablet  2 times daily with meals        06/16/20 1246    traMADol (ULTRAM) 50 MG tablet  Every 8 hours PRN        06/16/20 1246           Note:  This document was prepared using Dragon voice recognition software and may include unintentional dictation errors.   Shaune Pollack, MD 06/16/20 1248

## 2020-06-16 NOTE — ED Triage Notes (Signed)
Pt c/o LUQ pain for the past 3-4 months, worsened in the past 24hrs with severe pain N/V/D.Marland Kitchen pt is groaning and tearful on arrival.

## 2020-06-16 NOTE — ED Notes (Signed)
Patient transported to CT 

## 2021-08-30 ENCOUNTER — Other Ambulatory Visit: Payer: Self-pay

## 2021-08-30 ENCOUNTER — Emergency Department
Admission: EM | Admit: 2021-08-30 | Discharge: 2021-08-30 | Disposition: A | Discharge status: Home / Self Care | Payer: Medicaid Other | Attending: Emergency Medicine | Admitting: Emergency Medicine

## 2021-08-30 ENCOUNTER — Emergency Department: Payer: Medicaid Other

## 2021-08-30 DIAGNOSIS — W541XXA Struck by dog, initial encounter: Secondary | ICD-10-CM | POA: Insufficient documentation

## 2021-08-30 DIAGNOSIS — M25571 Pain in right ankle and joints of right foot: Secondary | ICD-10-CM | POA: Insufficient documentation

## 2021-08-30 DIAGNOSIS — S93401A Sprain of unspecified ligament of right ankle, initial encounter: Secondary | ICD-10-CM | POA: Insufficient documentation

## 2021-08-30 MED ORDER — NAPROXEN 500 MG PO TABS: 500.0000 mg | ORAL_TABLET | Freq: Two times a day (BID) | ORAL | 0 refills | Status: DC

## 2021-08-30 MED ORDER — TRAMADOL HCL 50 MG PO TABS
50.0000 mg | ORAL_TABLET | Freq: Once | ORAL | Status: AC
Start: 2021-08-30 — End: 2021-08-30
  Administered 2021-08-30: 50 mg via ORAL
  Filled 2021-08-30: qty 1

## 2021-08-30 MED ORDER — NAPROXEN 500 MG PO TABS
500.0000 mg | ORAL_TABLET | Freq: Once | ORAL | Status: AC
  Administered 2021-08-30: 500 mg via ORAL
  Filled 2021-08-30: qty 1

## 2021-08-30 MED ORDER — TRAMADOL HCL 50 MG PO TABS: 50.0000 mg | ORAL_TABLET | Freq: Four times a day (QID) | ORAL | 0 refills | Status: DC | PRN

## 2021-08-30 NOTE — ED Triage Notes (Signed)
Patient to ER via [facility] with complaints of right ankle pain. Reports that last night she had a ground level fall over her dog and injured her right ankle. Difficulty ambulating.  ? ?Reports that around 1.5 months ago she had a similar episode where she fell over her dog and her ankle felt the same, did not seek treatment at that time.  ? ?

## 2021-08-30 NOTE — ED Notes (Signed)
Pt Dc papers reviewed and pt gives verbal consent to DC  ?

## 2021-08-30 NOTE — ED Provider Notes (Signed)
? ?[facility] ?Provider Note ? ? ? Event Date/Time  ? First MD Initiated Contact with Patient 08/30/21 1827   ?  (approximate) ? ? ?History  ? ?Ankle Pain ? ? ?HPI ? ?Paula Bennett is a 26 y.o. female with no significant past medical history and as listed in EMR presents to the emergency department for evaluation of right ankle pain. Her dog tripped her yesterday and she twisted it. Pain and swelling since. No alleviating measures prior to arrival. ? ?  ? ? ?Physical Exam  ? ?Triage Vital Signs: ?ED Triage Vitals  ?Enc Vitals Group  ?   BP 08/30/21 1702 117/73  ?   Pulse Rate 08/30/21 1702 (!) 106  ?   Resp 08/30/21 1702 18  ?   Temp 08/30/21 1702 98.4 ?F (36.9 ?C)  ?   Temp Source 08/30/21 1702 Oral  ?   SpO2 08/30/21 1702 98 %  ?   Weight --   ?   Height 08/30/21 1703 5\' 7"  (1.702 m)  ?   Head Circumference --   ?   Peak Flow --   ?   Pain Score 08/30/21 1703 8  ?   Pain Loc --   ?   Pain Edu? --   ?   Excl. in GC? --   ? ? ?Most recent vital signs: ?Vitals:  ? 08/30/21 1702 08/30/21 1915  ?BP: 117/73 124/76  ?Pulse: (!) 106 98  ?Resp: 18 17  ?Temp: 98.4 ?F (36.9 ?C) 98.2 ?F (36.8 ?C)  ?SpO2: 98% 99%  ? ? ?General: Awake, no distress.  ?CV:  Good peripheral perfusion.  ?Resp:  Normal effort.  ?Abd:  No distention.  ?Other:  Diffuse edema noted over the lateral malleolus of the right ankle. ? ? ?ED Results / Procedures / Treatments  ? ?Labs ?(all labs ordered are listed, but only abnormal results are displayed) ?Labs Reviewed - No data to display ? ? ?EKG ? ?Not indicated. ? ? ?RADIOLOGY ? ?Image and radiology report reviewed by me. ? ? ? ?PROCEDURES: ? ?Critical Care performed: No ? ?Procedures ? ? ?MEDICATIONS ORDERED IN ED: ?Medications  ?naproxen (NAPROSYN) tablet 500 mg (500 mg Oral Given 08/30/21 1920)  ?traMADol (ULTRAM) tablet 50 mg (50 mg Oral Given 08/30/21 1920)  ? ? ? ?IMPRESSION / MDM / ASSESSMENT AND PLAN / ED COURSE  ? ?I have reviewed the triage note. ? ?Differential  diagnosis includes, but is not limited to: Tibia fracture/fibula fracture, ankle sprain ? ?26 year old female presenting to the emergency department for treatment and evaluation after twisting her ankle yesterday.  See HPI for further details. ? ?On exam, she does have some soft tissue swelling over the lateral malleolus without deformity of the ankle.  [place] ankle rules are negative. ? ?X-ray negative for acute abnormality.  She will be placed in a lace up ankle brace and given crutches.  Tramadol and Naprosyn sent to the pharmacy for as well.  She is to follow-up with podiatry if not improving over the week.  She was also encouraged to rest, ice, and elevate until pain and swelling has improved. ? ?  ? ? ?FINAL CLINICAL IMPRESSION(S) / ED DIAGNOSES  ? ?Final diagnoses:  ?Sprain of right ankle, unspecified ligament, initial encounter  ? ? ? ?Rx / DC Orders  ? ?ED Discharge Orders   ? ?      Ordered  ?  naproxen (NAPROSYN) 500 MG tablet  2 times daily with  meals       ? 08/30/21 1840  ?  traMADol (ULTRAM) 50 MG tablet  Every 6 hours PRN       ? 08/30/21 1840  ? ?  ?  ? ?  ? ? ? ?Note:  This document was prepared using Dragon voice recognition software and may include unintentional dictation errors. ?  ?Chinita Pester, FNP ?08/30/21 2118 ? ?  ?Minna Antis, MD ?08/30/21 2316 ? ?

## 2021-08-30 NOTE — ED Notes (Signed)
Pt educated on splinting application and crutches.  ?Pt received doc note for work x 3 days  ?Pt expressed understanding and feels safe for DC ?Pt wheeled out to vehicle where friend was waiting   ?

## 2022-07-22 ENCOUNTER — Emergency Department
Admission: EM | Admit: 2022-07-22 | Discharge: 2022-07-22 | Disposition: A | Discharge status: Home / Self Care | Payer: Medicaid Other | Attending: Emergency Medicine | Admitting: Emergency Medicine

## 2022-07-22 ENCOUNTER — Other Ambulatory Visit: Payer: Self-pay

## 2022-07-22 DIAGNOSIS — Z1152 Encounter for screening for COVID-19: Secondary | ICD-10-CM | POA: Diagnosis not present

## 2022-07-22 DIAGNOSIS — J101 Influenza due to other identified influenza virus with other respiratory manifestations: Secondary | ICD-10-CM | POA: Insufficient documentation

## 2022-07-22 DIAGNOSIS — R509 Fever, unspecified: Secondary | ICD-10-CM | POA: Diagnosis present

## 2022-07-22 LAB — GROUP A STREP BY PCR: Group A Strep by PCR: NOT DETECTED

## 2022-07-22 LAB — RESP PANEL BY RT-PCR (RSV, FLU A&B, COVID)  RVPGX2
Influenza A by PCR: NEGATIVE
Influenza B by PCR: POSITIVE — AB
Resp Syncytial Virus by PCR: NEGATIVE
SARS Coronavirus 2 by RT PCR: NEGATIVE

## 2022-07-22 MED ORDER — PSEUDOEPH-BROMPHEN-DM 30-2-10 MG/5ML PO SYRP: 5.0000 mL | ORAL_SOLUTION | Freq: Four times a day (QID) | ORAL | 0 refills | Status: AC | PRN

## 2022-07-22 MED ORDER — LIDOCAINE VISCOUS HCL 2 % MT SOLN: 10.0000 mL | Freq: Three times a day (TID) | OROMUCOSAL | 0 refills | Status: AC

## 2022-07-22 MED ORDER — LIDOCAINE VISCOUS HCL 2 % MT SOLN
15.0000 mL | Freq: Once | OROMUCOSAL | Status: AC
Start: 2022-07-22 — End: 2022-07-22
  Administered 2022-07-22: 15 mL via OROMUCOSAL
  Filled 2022-07-22: qty 15

## 2022-07-22 NOTE — ED Triage Notes (Signed)
Pt reports bodyaches, fever, sore throat, cough and congestion since Tuesday. Denies recent exposures.

## 2022-07-22 NOTE — ED Provider Notes (Signed)
[facility] Provider Note    Event Date/Time   First MD Initiated Contact with Patient 07/22/22 1010     (approximate)   History   Fever, Sore Throat, and Generalized Body Aches   HPI  Paula Bennett is a 27 y.o. female presents to the ED with complaint of sore throat, cough, congestion and over not feeling well for the last 4 days.  Patient reports that she has not been able to drink fluids frequently due to pain with swallowing.  Patient vapes.     Physical Exam   Triage Vital Signs: ED Triage Vitals  Enc Vitals Group     BP 07/22/22 0955 125/76     Pulse Rate 07/22/22 0955 96     Resp 07/22/22 0955 20     Temp 07/22/22 0955 98.4 F (36.9 C)     Temp Source 07/22/22 0955 Oral     SpO2 07/22/22 0955 98 %     Weight 07/22/22 0954 184 lb 15.5 oz (83.9 kg)     Height 07/22/22 0954 '5\' 6"'$  (1.676 m)     Head Circumference --      Peak Flow --      Pain Score 07/22/22 0954 8     Pain Loc --      Pain Edu? --      Excl. in [place]? --     Most recent vital signs: Vitals:   07/22/22 0955  BP: 125/76  Pulse: 96  Resp: 20  Temp: 98.4 F (36.9 C)  SpO2: 98%     General: Awake, no distress.  CV:  Good peripheral perfusion.  Heart regular rate and rhythm. Resp:  Normal effort.  Lungs are clear bilaterally. Abd:  No distention.  Soft, nontender. Other:     ED Results / Procedures / Treatments   Labs (all labs ordered are listed, but only abnormal results are displayed) Labs Reviewed  RESP PANEL BY RT-PCR (RSV, FLU A&B, COVID)  [id number] - Abnormal; Notable for the following components:      Result Value   Influenza B by PCR POSITIVE (*)    All other components within normal limits  GROUP A STREP BY PCR      PROCEDURES:  Critical Care performed:   Procedures   MEDICATIONS ORDERED IN ED: Medications  lidocaine (XYLOCAINE) 2 % viscous mouth solution 15 mL (15 mLs Mouth/Throat Given 07/22/22 1110)     IMPRESSION / MDM /  ASSESSMENT AND PLAN / ED COURSE  I reviewed the triage vital signs and the nursing notes.   Differential diagnosis includes, but is not limited to, COVID, influenza, RSV, strep pharyngitis, viral URI.  27 year old female presents to the ED with complaint of sore throat, cough, congestion with decreased appetite for the last 4 days.  Patient was made aware that her respiratory panel is positive for influenza B.  We will try viscous lidocaine in the ED to see if this helps with her throat pain and encouraged her to drink fluids frequently to stay hydrated.      Patient's presentation is most consistent with acute complicated illness / injury requiring diagnostic workup.  FINAL CLINICAL IMPRESSION(S) / ED DIAGNOSES   Final diagnoses:  Influenza B     Rx / DC Orders   ED Discharge Orders          Ordered    brompheniramine-pseudoephedrine-DM 30-2-10 MG/5ML syrup  4 times daily PRN        07/22/22 1119  lidocaine (XYLOCAINE) 2 % solution  3 times daily before meals & bedtime        07/22/22 1119             Note:  This document was prepared using Dragon voice recognition software and may include unintentional dictation errors.   Johnn Hai, PA-C 07/22/22 1125    Lavonia Drafts, MD 07/22/22 [phone]

## 2022-07-22 NOTE — Discharge Instructions (Addendum)
Follow-up with your primary care provider or [place] urgent care if any continued problems or concerns.  Increase fluids to stay hydrated.  You may take Tylenol or ibuprofen as needed for throat pain, body aches, headaches or fever.  A prescription for lidocaine solution was sent to the pharmacy to use before meals and at bedtime for your throat pain.  A prescription for Bromfed-DM as needed for cough.  With the influenza you are contagious and other members of your family may get sick.

## 2022-07-22 NOTE — ED Notes (Signed)
Pt instructed to "swish & spit in sink"
